# Patient Record
Sex: Male | Born: 1995 | Race: White | Hispanic: No | Marital: Married | Smoking: Never smoker
Health system: Southern US, Community
[De-identification: ages and names within clinical notes are randomized; demographics above are authoritative.]

---

## 2021-08-12 ENCOUNTER — Other Ambulatory Visit: Payer: Self-pay

## 2021-08-12 ENCOUNTER — Emergency Department
Admission: EM | Admit: 2021-08-12 | Discharge: 2021-08-12 | Disposition: A | Discharge status: Home / Self Care | Payer: 59 | Attending: Emergency Medicine | Admitting: Emergency Medicine

## 2021-08-12 ENCOUNTER — Emergency Department: Payer: 59

## 2021-08-12 DIAGNOSIS — S300XXA Contusion of lower back and pelvis, initial encounter: Secondary | ICD-10-CM | POA: Diagnosis not present

## 2021-08-12 DIAGNOSIS — W109XXA Fall (on) (from) unspecified stairs and steps, initial encounter: Secondary | ICD-10-CM | POA: Diagnosis not present

## 2021-08-12 DIAGNOSIS — S34109A Unspecified injury to unspecified level of lumbar spinal cord, initial encounter: Secondary | ICD-10-CM | POA: Diagnosis present

## 2021-08-12 DIAGNOSIS — Y9301 Activity, walking, marching and hiking: Secondary | ICD-10-CM | POA: Insufficient documentation

## 2021-08-12 NOTE — Discharge Instructions (Addendum)
Tylenol or ibuprofen for pain. Apply ice off and on throughout the day. Follow up with primary care if not improving over the week.

## 2021-08-12 NOTE — ED Notes (Signed)
See triage note.

## 2021-08-12 NOTE — ED Provider Notes (Signed)
[facility] Emergency Department Provider Note ____________________________________________  Time seen: Approximately 9:09 AM  I have reviewed the triage vital signs and the nursing notes.   HISTORY  Chief Complaint Fall and Tailbone Pain    HPI Dejaun Vidrio is a 25 y.o. male who presents to the emergency department for evaluation and treatment of pain in the sacrum/coccyx area after slipping on the wooden steps and landed on this tailbone. He slid down about 4 steps while holding his infant daughter. Denies striking his head or other injury.   No past medical history on file.  There are no problems to display for this patient.   Prior to Admission medications   Not on File    Allergies Patient has no known allergies.  No family history on file.  Social History    Review of Systems Constitutional: Negative for fever. Cardiovascular: Negative for chest pain. Respiratory: Negative for shortness of breath. Musculoskeletal: Positive for lower sacrum/coccyx pain. Skin: No open wounds.  Neurological: Negative for decrease in sensation  ____________________________________________   PHYSICAL EXAM:  VITAL SIGNS: ED Triage Vitals  Enc Vitals Group     BP 08/12/21 0837 123/78     Pulse Rate 08/12/21 0837 92     Resp 08/12/21 0837 16     Temp 08/12/21 0837 98.3 F (36.8 C)     Temp Source 08/12/21 0837 Oral     SpO2 08/12/21 0837 99 %     Weight 08/12/21 0838 177 lb (80.3 kg)     Height 08/12/21 0838 6\' 2"  (1.88 m)     Head Circumference --      Peak Flow --      Pain Score 08/12/21 0838 3     Pain Loc --      Pain Edu? --      Excl. in GC? --     Constitutional: Alert and oriented. Well appearing and in no acute distress. Eyes: Conjunctivae are clear without discharge or drainage Head: Atraumatic Neck: Supple Respiratory: No cough. Respirations are even and unlabored. Musculoskeletal: Tenderness over the lower sacrum and  coccyx. Neurologic: Awake, alert, oriented.  Skin: No open wounds.  Psychiatric: Affect and behavior are appropriate.  ____________________________________________   LABS (all labs ordered are listed, but only abnormal results are displayed)  Labs Reviewed - No data to display ____________________________________________  RADIOLOGY  Imaging of the sacrum and coccyx without definite findings of acute fracture.  I, 14/09/22, personally viewed and evaluated these images (plain radiographs) as part of my medical decision making, as well as reviewing the written report by the radiologist.  DG Sacrum/Coccyx  Result Date: 08/12/2021 CLINICAL DATA:  Fall, coccyx pain EXAM: SACRUM AND COCCYX - 2+ VIEW COMPARISON:  None. FINDINGS: Bony pelvis intact. Sacrum unremarkable. Normal SI joints. On the lateral view, there is slight angulation to the coccyx tip, which can be a normal variant. No definite fracture by plain radiography. IMPRESSION: No definite acute osseous finding. Electronically Signed   By: 14/05/2021.  Shick M.D.   On: 08/12/2021 09:26   ____________________________________________   PROCEDURES  Procedures  ____________________________________________   INITIAL IMPRESSION / ASSESSMENT AND PLAN / ED COURSE  Colin Ellers is a 25 y.o. who presents to the emergency department for treatment and evaluation of pain in the lower sacrum and coccyx after mechanical, non-syncopal fall this morning. See HPI for further details. Will get imaging.  X-ray of sacrum and coccyx is without definite fracture. Home care discussed. He is to  follow up with primary care if not improving over the next couple of weeks. For symptoms that change or worsen, he it to return to the ER if unable to schedule an appointment.  Medications - No data to display  Pertinent labs & imaging results that were available during my care of the patient were reviewed by me and considered in my medical decision making  (see chart for details).   _________________________________________   FINAL CLINICAL IMPRESSION(S) / ED DIAGNOSES  Final diagnoses:  Contusion of coccyx, initial encounter    ED Discharge Orders     None        If controlled substance prescribed during this visit, 12 month history viewed on the [facility] prior to issuing an initial prescription for Schedule II or III opiod.    Chinita Pester, FNP 08/13/21 [id number]    Merwyn Katos, MD 08/13/21 [phone]

## 2021-08-12 NOTE — ED Triage Notes (Signed)
Pt reports that as he walking down the stairs carrying his daughter the cat caused him to trip and he fell down onto the stairs hitting hard when he landed, pt states he then slid down the remainder of the stairs, tore the left arm sleeve attempting to brace himself and hold his daughter, pt is c/o tailbone pain at this time, pt was ambulatory without difficulty back to exam room

## 2021-12-22 ENCOUNTER — Ambulatory Visit
Admission: EM | Admit: 2021-12-22 | Discharge: 2021-12-22 | Disposition: A | Discharge status: Home / Self Care | Payer: BLUE CROSS/BLUE SHIELD | Attending: Emergency Medicine | Admitting: Emergency Medicine

## 2021-12-22 DIAGNOSIS — J029 Acute pharyngitis, unspecified: Secondary | ICD-10-CM | POA: Insufficient documentation

## 2021-12-22 DIAGNOSIS — B084 Enteroviral vesicular stomatitis with exanthem: Secondary | ICD-10-CM | POA: Diagnosis not present

## 2021-12-22 DIAGNOSIS — R21 Rash and other nonspecific skin eruption: Secondary | ICD-10-CM | POA: Diagnosis not present

## 2021-12-22 LAB — GROUP A STREP BY PCR: Group A Strep by PCR: NOT DETECTED

## 2021-12-22 MED ORDER — MUPIROCIN 2 % EX OINT
1.0000 | TOPICAL_OINTMENT | Freq: Two times a day (BID) | CUTANEOUS | 1 refills | Status: AC
Start: 2021-12-22 — End: ?

## 2021-12-22 NOTE — ED Triage Notes (Signed)
Pt c/o hand soreness, dry skin, blisters along hands and face x2days. ?

## 2021-12-22 NOTE — ED Provider Notes (Signed)
?[facility] ? ? ? ?CSN: [id number] ?Arrival date & time: 12/22/21  [id number] ? ? ?  ? ?History   ?Chief Complaint ?Chief Complaint  ?Patient presents with  ? Rash  ? Blister  ? ? ?HPI ?Jon Johnson is a 26 y.o. male presenting with his young child.  Patient reports over the past couple of days he has had red bumps on his hands, feet and on his face and around his mouth.  He also reports some congestion, cough and sore throat.  Patient says his child has had similar symptoms but without the rash.  His wife was recently diagnosed with strep throat.  Patient reports his URI symptoms have improved.  He does state that his hands and feet are little painful.  Baby was recently diagnosed with croup.  Patient denies any associated fevers, fatigue, weakness.  No history of any skin conditions.  No other complaints. ? ?HPI ? ?History reviewed. No pertinent past medical history. ? ?There are no problems to display for this patient. ? ? ?History reviewed. No pertinent surgical history. ? ? ? ? ?Home Medications   ? ?Prior to Admission medications   ?Medication Sig Start Date End Date Taking? Authorizing Provider  ?mupirocin ointment (BACTROBAN) 2 % Apply 1 application. topically 2 (two) times daily. 12/22/21  Yes Shirlee LatchEaves, Gaddiel Cullens B, PA-C  ?ZENZEDI 30 MG TABS Take 1 tablet by mouth daily. 12/14/21   [provider]  ? ? ?Family History ?History reviewed. No pertinent family history. ? ?Social History ?Social History  ? ?Tobacco Use  ? Smoking status: Never  ? Smokeless tobacco: Never  ?Vaping Use  ? Vaping Use: Every day  ?Substance Use Topics  ? Alcohol use: Yes  ? Drug use: Not Currently  ? ? ? ?Allergies   ?Patient has no known allergies. ? ? ?Review of Systems ?Review of Systems  ?Constitutional:  Negative for fatigue and fever.  ?HENT:  Positive for congestion, mouth sores (around mouth), rhinorrhea and sore throat. Negative for sinus pressure and sinus pain.   ?Respiratory:  Positive for cough. Negative for  shortness of breath.   ?Gastrointestinal:  Negative for abdominal pain, diarrhea, nausea and vomiting.  ?Musculoskeletal:  Negative for arthralgias, joint swelling and myalgias.  ?Skin:  Positive for rash.  ?Neurological:  Negative for weakness, light-headedness and headaches.  ?Hematological:  Negative for adenopathy.  ? ? ?Physical Exam ?Triage Vital Signs ?ED Triage Vitals  ?Enc Vitals Group  ?   BP   ?   Pulse   ?   Resp   ?   Temp   ?   Temp src   ?   SpO2   ?   Weight   ?   Height   ?   Head Circumference   ?   Peak Flow   ?   Pain Score   ?   Pain Loc   ?   Pain Edu?   ?   Excl. in GC?   ? ?No data found. ? ?Updated Vital Signs ?BP (!) 122/99 (BP Location: Left Arm)   Pulse (!) 120   Temp 98.9 ?F (37.2 ?C) (Oral)   Resp 18   Ht 6\' 2"  (1.88 m)   Wt 180 lb (81.6 kg)   SpO2 98%   BMI 23.11 kg/m?  ?   ? ?Physical Exam ?Vitals and nursing note reviewed.  ?Constitutional:   ?   General: He is not in acute distress. ?   Appearance: Normal  appearance. He is well-developed. He is not ill-appearing.  ?   Comments: Patient is holding his young child who is about a year and a half.  Child has nasal congestion and a rash around her nose.  ?HENT:  ?   Head: Normocephalic and atraumatic.  ?   Nose: Nose normal.  ?   Mouth/Throat:  ?   Mouth: Mucous membranes are moist.  ?   Pharynx: Oropharynx is clear. Posterior oropharyngeal erythema present.  ?Eyes:  ?   General: No scleral icterus. ?   Conjunctiva/sclera: Conjunctivae normal.  ?Cardiovascular:  ?   Rate and Rhythm: Regular rhythm. Tachycardia present.  ?Pulmonary:  ?   Effort: Pulmonary effort is normal. No respiratory distress.  ?   Breath sounds: Normal breath sounds.  ?Musculoskeletal:  ?   Cervical back: Neck supple.  ?Skin: ?   General: Skin is warm and dry.  ?   Capillary Refill: Capillary refill takes less than 2 seconds.  ?   Findings: Rash present.  ?   Comments: There are erythematous papules of hands and feet.  Erythematous papules, vesicles and  ulcerations of face, ears and around mouth.  No intraoral lesions.  ?Neurological:  ?   General: No focal deficit present.  ?   Mental Status: He is alert. Mental status is at baseline.  ?   Motor: No weakness.  ?   Coordination: Coordination normal.  ?   Gait: Gait normal.  ?Psychiatric:     ?   Mood and Affect: Mood normal.     ?   Behavior: Behavior normal.     ?   Thought Content: Thought content normal.  ? ? ? ?UC Treatments / Results  ?Labs ?(all labs ordered are listed, but only abnormal results are displayed) ?Labs Reviewed  ?GROUP A STREP BY PCR  ? ? ?EKG ? ? ?Radiology ?No results found. ? ?Procedures ?Procedures (including critical care time) ? ?Medications Ordered in UC ?Medications - No data to display ? ?Initial Impression / Assessment and Plan / UC Course  ?I have reviewed the triage vital signs and the nursing notes. ? ?Pertinent labs & imaging results that were available during my care of the patient were reviewed by me and considered in my medical decision making (see chart for details). ? ?26 year old male presenting for 2-day history of papular, vesicular and ulcerative lesions/rash of hands, feet and face.  Patient also reporting nasal congestion, cough and sore throat.  Exposure to his child who was recently diagnosed with a viral illness and his wife who recently was diagnosed with strep throat. ? ?Patient is afebrile and overall well-appearing.  He is tachycardic.  On exam he has erythema of posterior pharynx but no intraoral lesions.  He does have a erythematous papular rash of hands and feet.  On his face he has papules, vesicles and ulcerations as well as around his mouth and on his ears.  Chest clear to auscultation and heart regular rhythm. ? ?PCR strep test obtained due to erythematous oropharynx and exposure to strep.  Advised patient we will contact him with the results. ? ?Clinical presentation consistent with hand-foot-and-mouth disease.  I suspect this child probably has this as  well given the rash she has around her nose and URI symptoms.  She does not have a rash on her hands and feet though.  I discussed this with him.  Advised him this is a viral illness and care is supportive.  I did provide him with  topical mupirocin ointment for the lesions.  We will treat with antibiotics if strep test is positive.  Advised increasing rest and fluids, ibuprofen and Tylenol for discomfort.  Advised returning or going to emergency department for acute worsening of symptoms. ? ?Negative strep.  ? ?Final Clinical Impressions(s) / UC Diagnoses  ? ?Final diagnoses:  ?Hand, foot and mouth disease  ?Sore throat  ?Rash  ? ? ? ?Discharge Instructions   ? ?  ?-Your rash is consistent with hand-foot-and-mouth since you have the lesions on your face, around the mouth and on your hands and feet.  You also have cold-like symptoms which is consistent with this infection.  It is a virus.  I will need to run its course. ?- We are testing you for strep since your throat is red and your wife has strep.  We will call you with the results. ?- I have sent an antibiotic ointment to put on the sores. ?- Ibuprofen and Tylenol for pain and discomfort ?- You should be improving over the next week.  Make sure to stay hydrated and rested. ?- Return for any severe worsening of your symptoms. ? ? ? ? ?ED Prescriptions   ? ? Medication Sig Dispense Auth. Provider  ? mupirocin ointment (BACTROBAN) 2 % Apply 1 application. topically 2 (two) times daily. 22 g Shirlee Latch, PA-C  ? ?  ? ?PDMP not reviewed this encounter. ?  ?Shirlee Latch, PA-C ?12/22/21 1003 ? ?

## 2021-12-22 NOTE — Discharge Instructions (Signed)
-  Your rash is consistent with hand-foot-and-mouth since you have the lesions on your face, around the mouth and on your hands and feet.  You also have cold-like symptoms which is consistent with this infection.  It is a virus.  I will need to run its course. ?- We are testing you for strep since your throat is red and your wife has strep.  We will call you with the results. ?- I have sent an antibiotic ointment to put on the sores. ?- Ibuprofen and Tylenol for pain and discomfort ?- You should be improving over the next week.  Make sure to stay hydrated and rested. ?- Return for any severe worsening of your symptoms. ?

## 2022-11-29 IMAGING — CR DG SACRUM/COCCYX 2+V
3 series · 3 of 3 positions shown · non-contrast
Comparison: None.

CLINICAL DATA: Fall, coccyx pain

EXAM:
SACRUM AND COCCYX - 2+ VIEW

[sacrum ap]
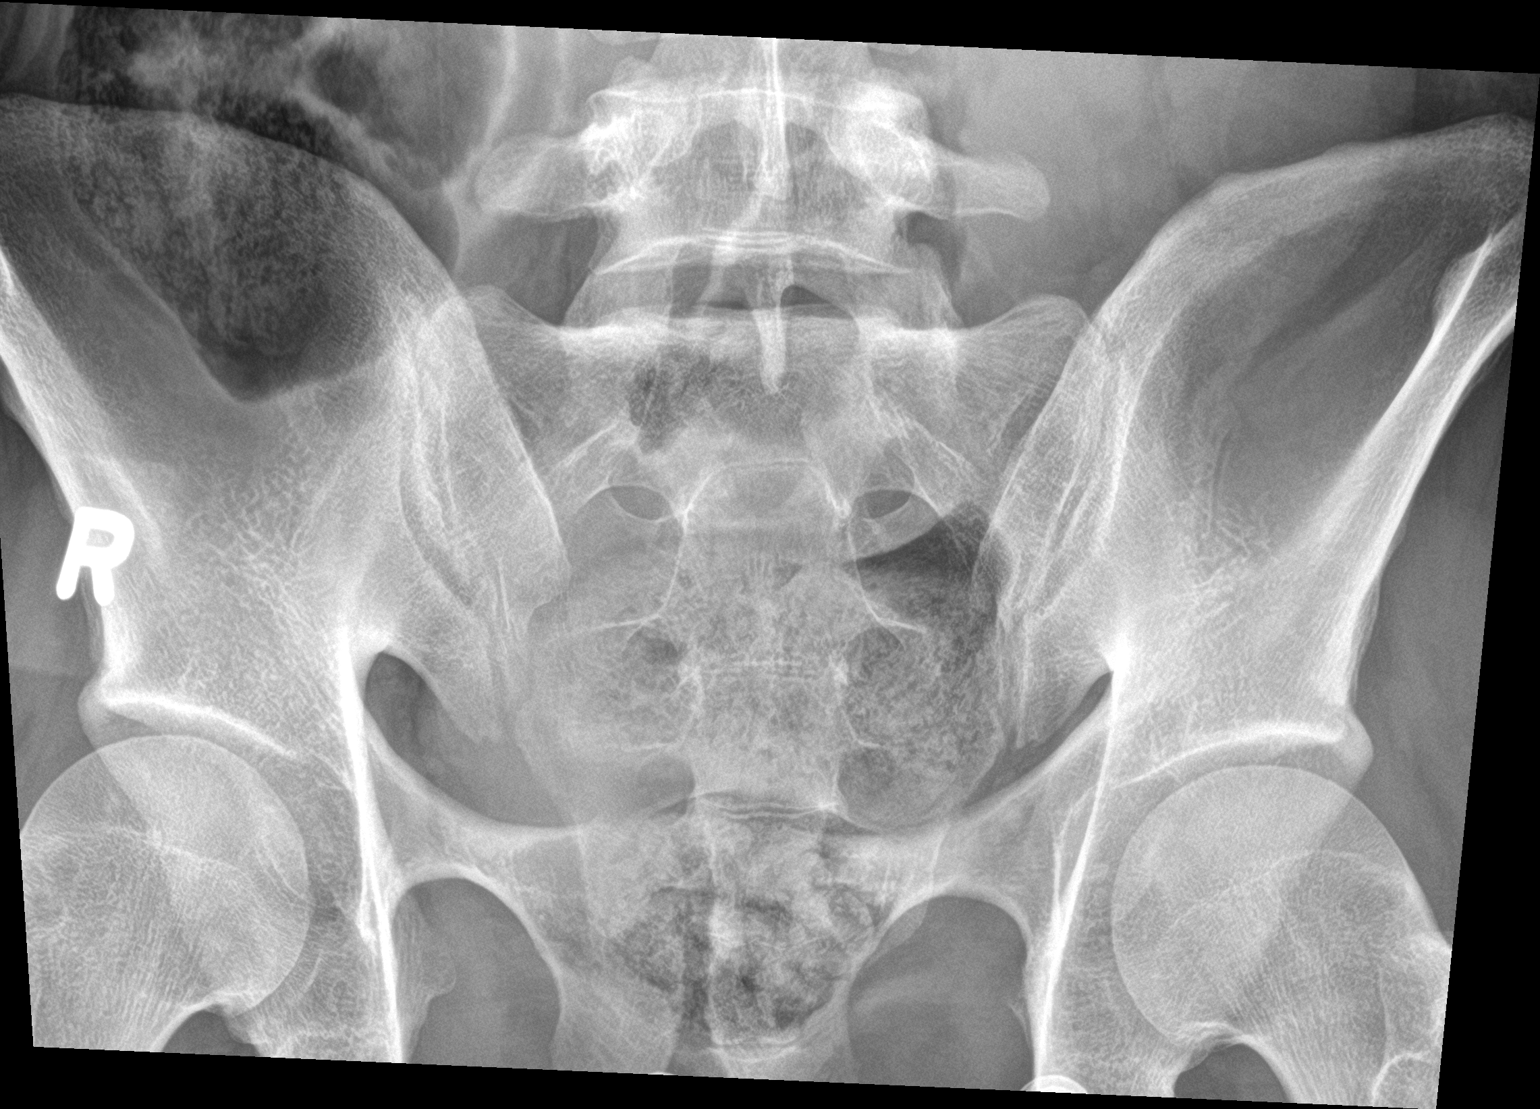

[coccyx ap]
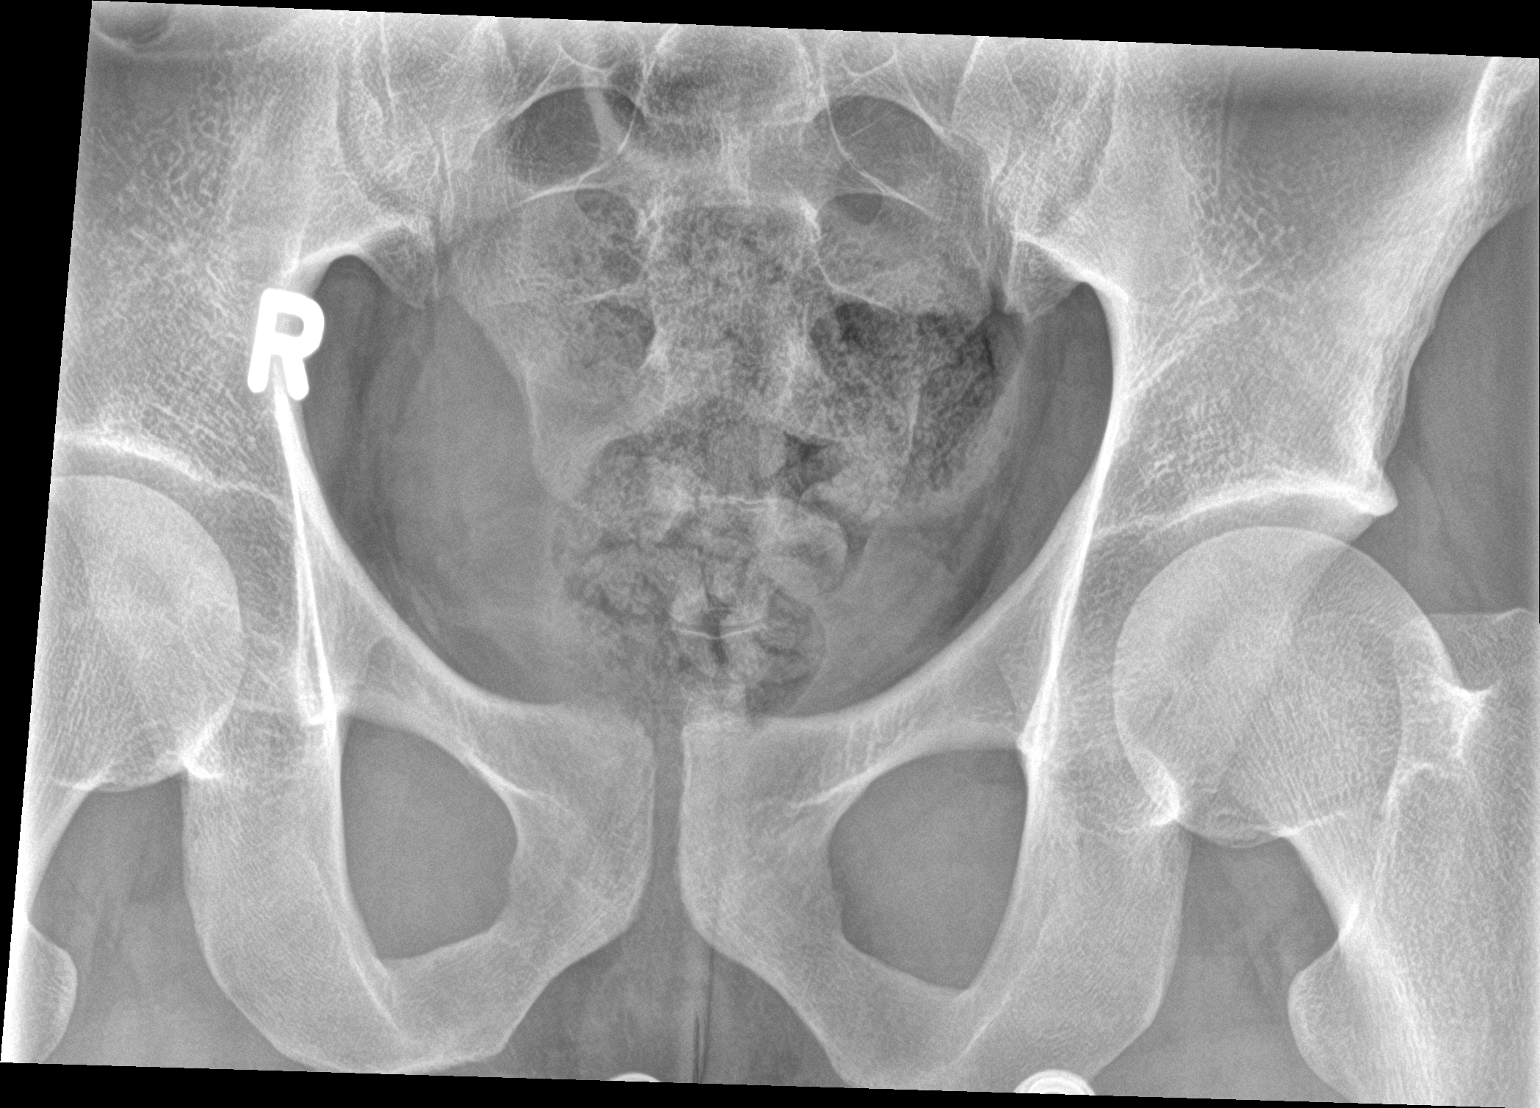

[sacrum lat]
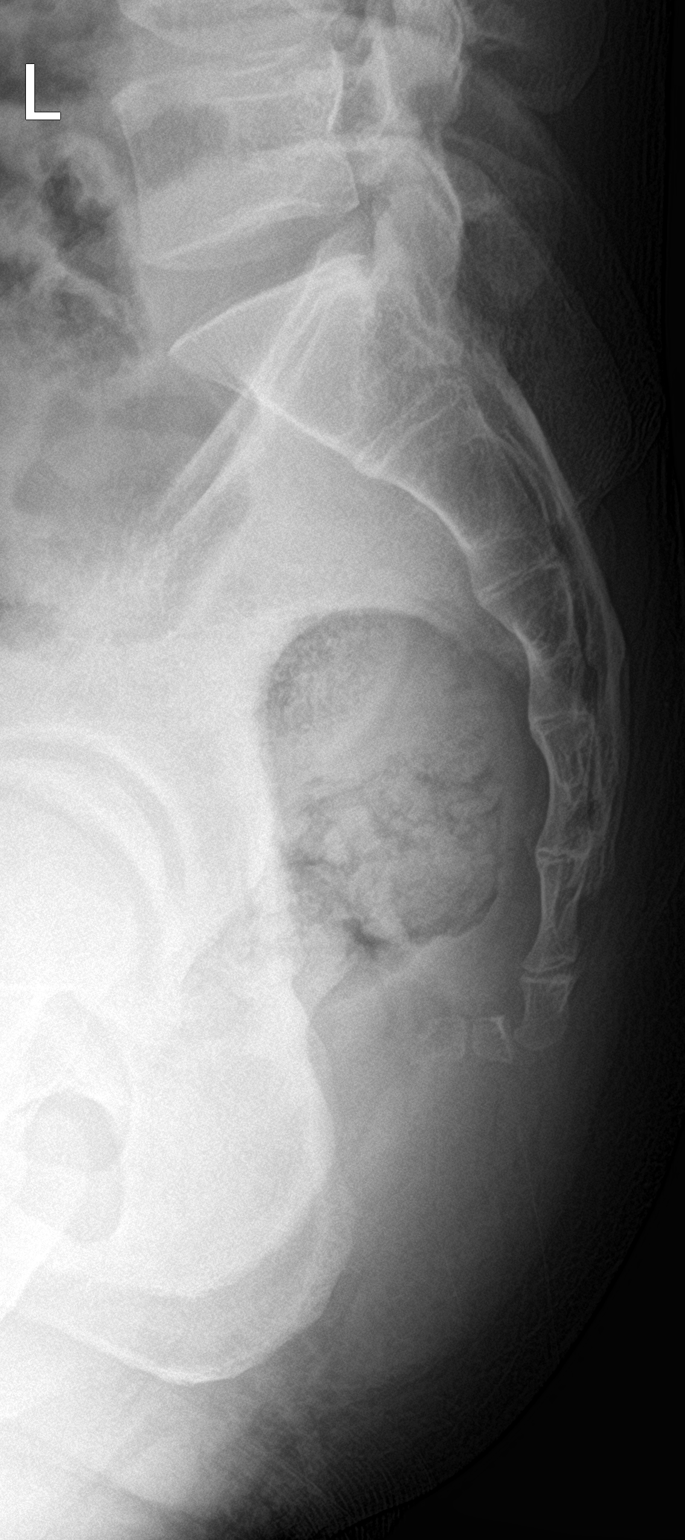

[3 of 3 positions shown; findings below may reference images not displayed]

FINDINGS: Bony pelvis intact. Sacrum unremarkable. Normal SI joints. On the
lateral view, there is slight angulation to the coccyx tip, which
can be a normal variant. No definite fracture by plain radiography.
IMPRESSION: No definite acute osseous finding.
